# Patient Record
Sex: Female | Born: 1970 | Race: White | Hispanic: No | Marital: Single | Smoking: Never smoker
Health system: Southern US, Community
[De-identification: ages and names within clinical notes are randomized; demographics above are authoritative.]

## PROBLEM LIST (undated history)

## (undated) DIAGNOSIS — F329 Major depressive disorder, single episode, unspecified: Secondary | ICD-10-CM

## (undated) DIAGNOSIS — J45909 Unspecified asthma, uncomplicated: Secondary | ICD-10-CM

## (undated) DIAGNOSIS — T7840XA Allergy, unspecified, initial encounter: Secondary | ICD-10-CM

## (undated) DIAGNOSIS — F32A Depression, unspecified: Secondary | ICD-10-CM

## (undated) HISTORY — DX: Unspecified asthma, uncomplicated: J45.909

## (undated) HISTORY — DX: Depression, unspecified: F32.A

## (undated) HISTORY — DX: Allergy, unspecified, initial encounter: T78.40XA

## (undated) HISTORY — DX: Major depressive disorder, single episode, unspecified: F32.9

---

## 1992-10-24 HISTORY — PX: HAND SURGERY: SHX662

## 2005-06-20 ENCOUNTER — Other Ambulatory Visit: Admission: RE | Admit: 2005-06-20 | Discharge: 2005-06-20 | Payer: Self-pay | Admitting: *Deleted

## 2006-07-03 ENCOUNTER — Other Ambulatory Visit: Admission: RE | Admit: 2006-07-03 | Discharge: 2006-07-03 | Payer: Self-pay | Admitting: *Deleted

## 2007-07-16 ENCOUNTER — Other Ambulatory Visit: Admission: RE | Admit: 2007-07-16 | Discharge: 2007-07-16 | Payer: Self-pay | Admitting: *Deleted

## 2011-04-01 ENCOUNTER — Ambulatory Visit (INDEPENDENT_AMBULATORY_CARE_PROVIDER_SITE_OTHER): Payer: PRIVATE HEALTH INSURANCE

## 2011-04-01 ENCOUNTER — Inpatient Hospital Stay (INDEPENDENT_AMBULATORY_CARE_PROVIDER_SITE_OTHER)
Admission: RE | Admit: 2011-04-01 | Discharge: 2011-04-01 | Disposition: A | Discharge status: Home / Self Care | Payer: PRIVATE HEALTH INSURANCE | Source: Ambulatory Visit | Attending: Family Medicine | Admitting: Family Medicine

## 2011-04-01 DIAGNOSIS — S62639A Displaced fracture of distal phalanx of unspecified finger, initial encounter for closed fracture: Secondary | ICD-10-CM

## 2011-09-05 ENCOUNTER — Other Ambulatory Visit: Payer: Self-pay | Admitting: Family Medicine

## 2011-09-05 DIAGNOSIS — Z1231 Encounter for screening mammogram for malignant neoplasm of breast: Secondary | ICD-10-CM

## 2011-09-23 ENCOUNTER — Ambulatory Visit
Admission: RE | Admit: 2011-09-23 | Discharge: 2011-09-23 | Disposition: A | Discharge status: Home / Self Care | Payer: PRIVATE HEALTH INSURANCE | Source: Ambulatory Visit | Attending: Family Medicine | Admitting: Family Medicine

## 2011-09-23 DIAGNOSIS — Z1231 Encounter for screening mammogram for malignant neoplasm of breast: Secondary | ICD-10-CM

## 2014-10-25 ENCOUNTER — Ambulatory Visit (INDEPENDENT_AMBULATORY_CARE_PROVIDER_SITE_OTHER): Payer: Commercial Managed Care - PPO | Admitting: Family Medicine

## 2014-10-25 VITALS — BP 114/68 | HR 89 | Temp 98.1°F | Resp 18 | Ht 65.0 in | Wt 133.4 lb

## 2014-10-25 DIAGNOSIS — J029 Acute pharyngitis, unspecified: Secondary | ICD-10-CM

## 2014-10-25 DIAGNOSIS — J069 Acute upper respiratory infection, unspecified: Secondary | ICD-10-CM

## 2014-10-25 MED ORDER — AMOXICILLIN 875 MG PO TABS: 875.0000 mg | ORAL_TABLET | Freq: Two times a day (BID) | ORAL | Status: AC

## 2014-10-25 NOTE — Patient Instructions (Signed)
Drink plenty of fluids and get enough rest.  Treat head congestion with an over-the-counter antihistamine decongestant if necessary  Treat cough with Robitussin-DM or Mucinex DM or equivalent  Tylenol or ibuprofen or lozenges for discomfort.  If not improving over the next several days go ahead and take the course of amoxicillin as directed  Return if further problems arise  Pharyngitis Pharyngitis is redness, pain, and swelling (inflammation) of your pharynx.  CAUSES  Pharyngitis is usually caused by infection. Most of the time, these infections are from viruses (viral) and are part of a cold. However, sometimes pharyngitis is caused by bacteria (bacterial). Pharyngitis can also be caused by allergies. Viral pharyngitis may be spread from person to person by coughing, sneezing, and personal items or utensils (cups, forks, spoons, toothbrushes). Bacterial pharyngitis may be spread from person to person by more intimate contact, such as kissing.  SIGNS AND SYMPTOMS  Symptoms of pharyngitis include:   Sore throat.   Tiredness (fatigue).   Low-grade fever.   Headache.  Joint pain and muscle aches.  Skin rashes.  Swollen lymph nodes.  Plaque-like film on throat or tonsils (often seen with bacterial pharyngitis). DIAGNOSIS  Your health care provider will ask you questions about your illness and your symptoms. Your medical history, along with a physical exam, is often all that is needed to diagnose pharyngitis. Sometimes, a rapid strep test is done. Other lab tests may also be done, depending on the suspected cause.  TREATMENT  Viral pharyngitis will usually get better in 3-4 days without the use of medicine. Bacterial pharyngitis is treated with medicines that kill germs (antibiotics).  HOME CARE INSTRUCTIONS   Drink enough water and fluids to keep your urine clear or pale yellow.   Only take over-the-counter or prescription medicines as directed by your health care  provider:   If you are prescribed antibiotics, make sure you finish them even if you start to feel better.   Do not take aspirin.   Get lots of rest.   Gargle with 8 oz of salt water ( tsp of salt per 1 qt of water) as often as every 1-2 hours to soothe your throat.   Throat lozenges (if you are not at risk for choking) or sprays may be used to soothe your throat. SEEK MEDICAL CARE IF:   You have large, tender lumps in your neck.  You have a rash.  You cough up green, yellow-brown, or bloody spit. SEEK IMMEDIATE MEDICAL CARE IF:   Your neck becomes stiff.  You drool or are unable to swallow liquids.  You vomit or are unable to keep medicines or liquids down.  You have severe pain that does not go away with the use of recommended medicines.  You have trouble breathing (not caused by a stuffy nose). MAKE SURE YOU:   Understand these instructions.  Will watch your condition.  Will get help right away if you are not doing well or get worse. Document Released: 10/10/2005 Document Revised: 07/31/2013 Document Reviewed: 06/17/2013 [facility] Patient Information 2015 [place], [place]. This information is not intended to replace advice given to you by your health care provider. Make sure you discuss any questions you have with your health care provider.

## 2014-10-25 NOTE — Progress Notes (Signed)
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 She has been ill for about 5 days with a sore throat. She has not had any documented fevers. She doesn't feel well with it. She has had a little pain in her years. She's had some facial congestion and drainage. She does cough some. The mucus has been yellow. She does not smoke. She is not on a lot of regular medication, takes some Paxil and Singulair. She does have a regular physician at [facility]. Her nephew did have a sore throat also, she had help care for him. He had a strep test which was negative last night. Culture is pending on that.  Objective: TMs are normal. Throat not particularly erythematous. Neck was supple without significant nodes. Chest clear to auscultation. Heart regular without murmurs.  Assessment: Viral pharyngitis and upper respiratory infection  Plan: Continue to treat symptomatically. However if she is not improving over the next couple of days she is to take a course of oral amoxicillin. She is given that prescription, to destroyed if one used.

## 2015-03-26 ENCOUNTER — Other Ambulatory Visit: Payer: Self-pay | Admitting: Physician Assistant

## 2015-03-26 DIAGNOSIS — Z1231 Encounter for screening mammogram for malignant neoplasm of breast: Secondary | ICD-10-CM

## 2015-04-22 ENCOUNTER — Ambulatory Visit
Admission: RE | Admit: 2015-04-22 | Discharge: 2015-04-22 | Disposition: A | Discharge status: Home / Self Care | Payer: Commercial Managed Care - PPO | Source: Ambulatory Visit | Attending: Physician Assistant | Admitting: Physician Assistant

## 2015-04-22 DIAGNOSIS — Z1231 Encounter for screening mammogram for malignant neoplasm of breast: Secondary | ICD-10-CM

## 2015-04-23 ENCOUNTER — Other Ambulatory Visit: Payer: Self-pay | Admitting: Physician Assistant

## 2015-04-23 DIAGNOSIS — R928 Other abnormal and inconclusive findings on diagnostic imaging of breast: Secondary | ICD-10-CM

## 2015-04-30 ENCOUNTER — Ambulatory Visit
Admission: RE | Admit: 2015-04-30 | Discharge: 2015-04-30 | Disposition: A | Discharge status: Home / Self Care | Payer: Commercial Managed Care - PPO | Source: Ambulatory Visit | Attending: Physician Assistant | Admitting: Physician Assistant

## 2015-04-30 DIAGNOSIS — R928 Other abnormal and inconclusive findings on diagnostic imaging of breast: Secondary | ICD-10-CM

## 2016-05-17 ENCOUNTER — Ambulatory Visit: Payer: Commercial Managed Care - PPO

## 2016-05-17 ENCOUNTER — Other Ambulatory Visit: Payer: Self-pay | Admitting: Family Medicine

## 2016-05-17 DIAGNOSIS — Z1231 Encounter for screening mammogram for malignant neoplasm of breast: Secondary | ICD-10-CM

## 2016-05-25 ENCOUNTER — Ambulatory Visit
Admission: RE | Admit: 2016-05-25 | Discharge: 2016-05-25 | Disposition: A | Discharge status: Home / Self Care | Payer: Commercial Managed Care - PPO | Source: Ambulatory Visit | Attending: Family Medicine | Admitting: Family Medicine

## 2016-05-25 DIAGNOSIS — Z1231 Encounter for screening mammogram for malignant neoplasm of breast: Secondary | ICD-10-CM

## 2018-05-11 ENCOUNTER — Other Ambulatory Visit: Payer: Self-pay | Admitting: Family Medicine

## 2018-05-11 DIAGNOSIS — Z1231 Encounter for screening mammogram for malignant neoplasm of breast: Secondary | ICD-10-CM

## 2018-05-31 ENCOUNTER — Ambulatory Visit
Admission: RE | Admit: 2018-05-31 | Discharge: 2018-05-31 | Disposition: A | Discharge status: Home / Self Care | Payer: 59 | Source: Ambulatory Visit | Attending: Family Medicine | Admitting: Family Medicine

## 2018-05-31 DIAGNOSIS — Z1231 Encounter for screening mammogram for malignant neoplasm of breast: Secondary | ICD-10-CM

## 2018-06-01 ENCOUNTER — Other Ambulatory Visit: Payer: Self-pay | Admitting: Family Medicine

## 2018-06-01 DIAGNOSIS — R928 Other abnormal and inconclusive findings on diagnostic imaging of breast: Secondary | ICD-10-CM

## 2018-06-05 ENCOUNTER — Ambulatory Visit
Admission: RE | Admit: 2018-06-05 | Discharge: 2018-06-05 | Disposition: A | Discharge status: Home / Self Care | Payer: 59 | Source: Ambulatory Visit | Attending: Family Medicine | Admitting: Family Medicine

## 2018-06-05 DIAGNOSIS — R928 Other abnormal and inconclusive findings on diagnostic imaging of breast: Secondary | ICD-10-CM

## 2019-04-21 IMAGING — MG DIGITAL DIAGNOSTIC UNILATERAL LEFT MAMMOGRAM
6 series · 6 of 6 positions shown · non-contrast
Comparison: Previous exam(s).

CLINICAL DATA: Screening recall for left breast calcifications.

EXAM:
DIGITAL DIAGNOSTIC LEFT MAMMOGRAM WITH CAD

[L CC (1 of 2)]
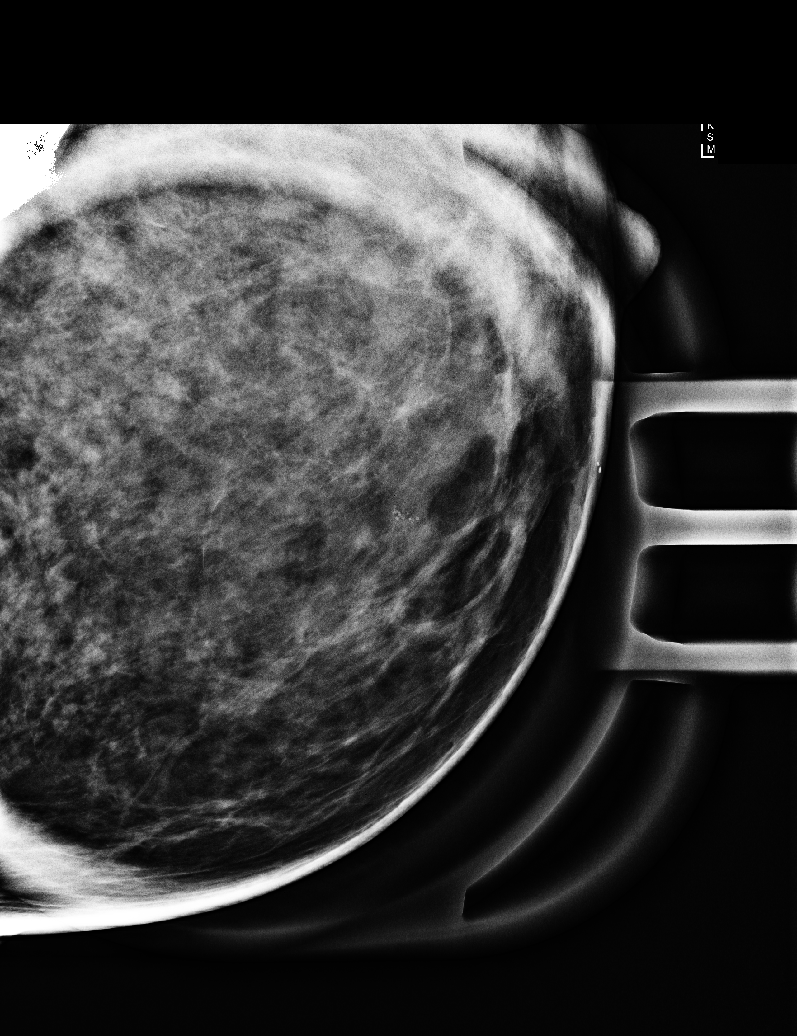

[L CC (2 of 2)]
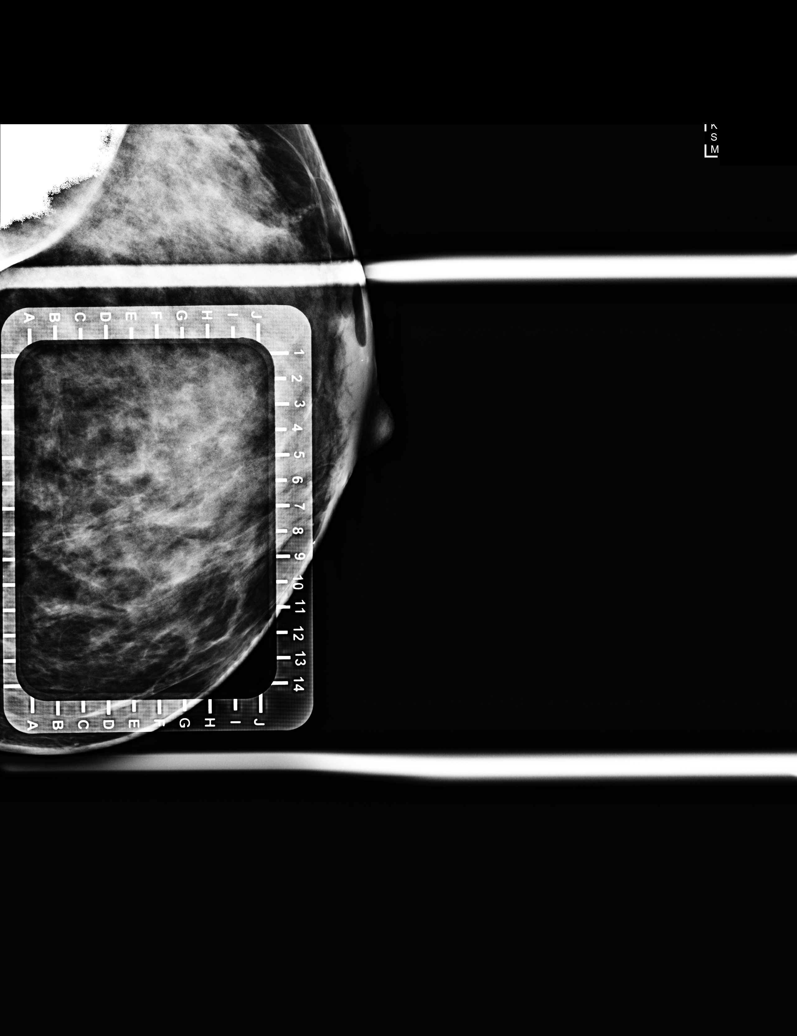

[L ML (1 of 4)]
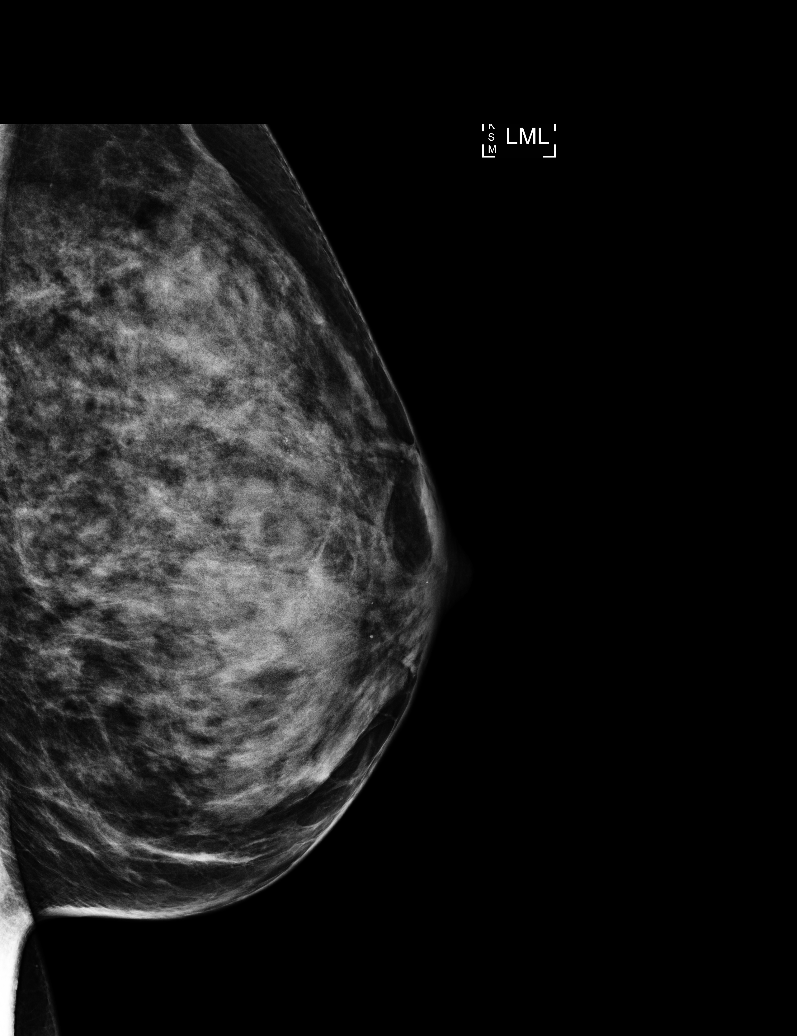

[L ML (2 of 4)]
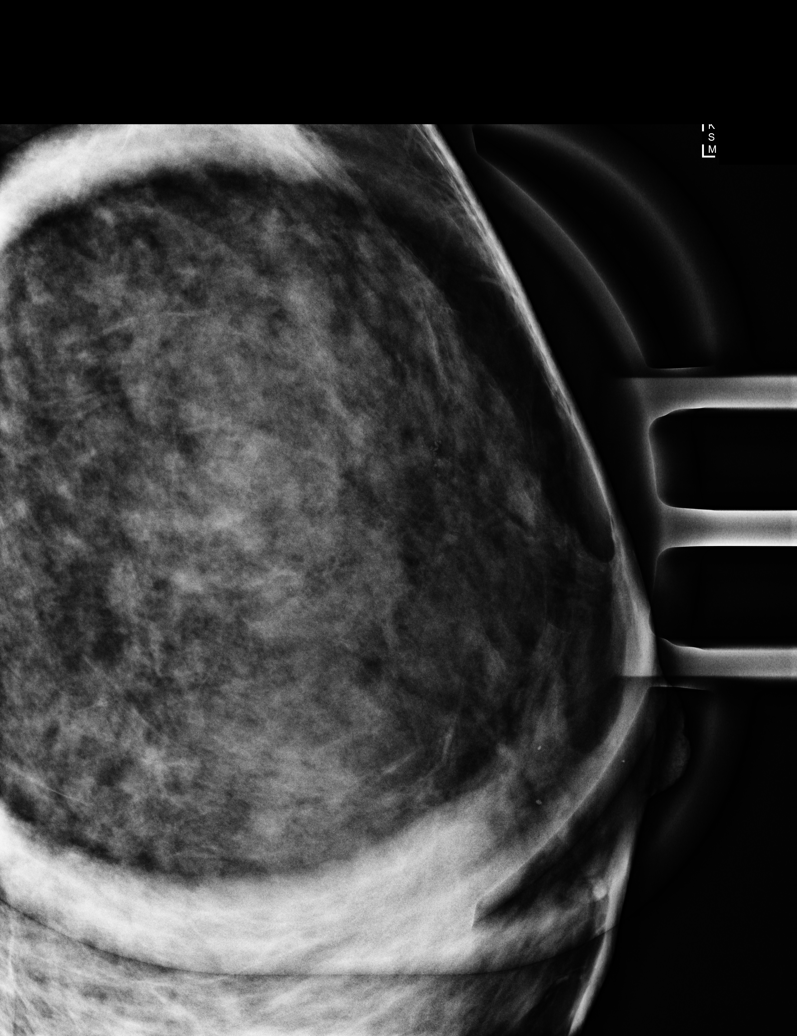

[L ML (3 of 4)]
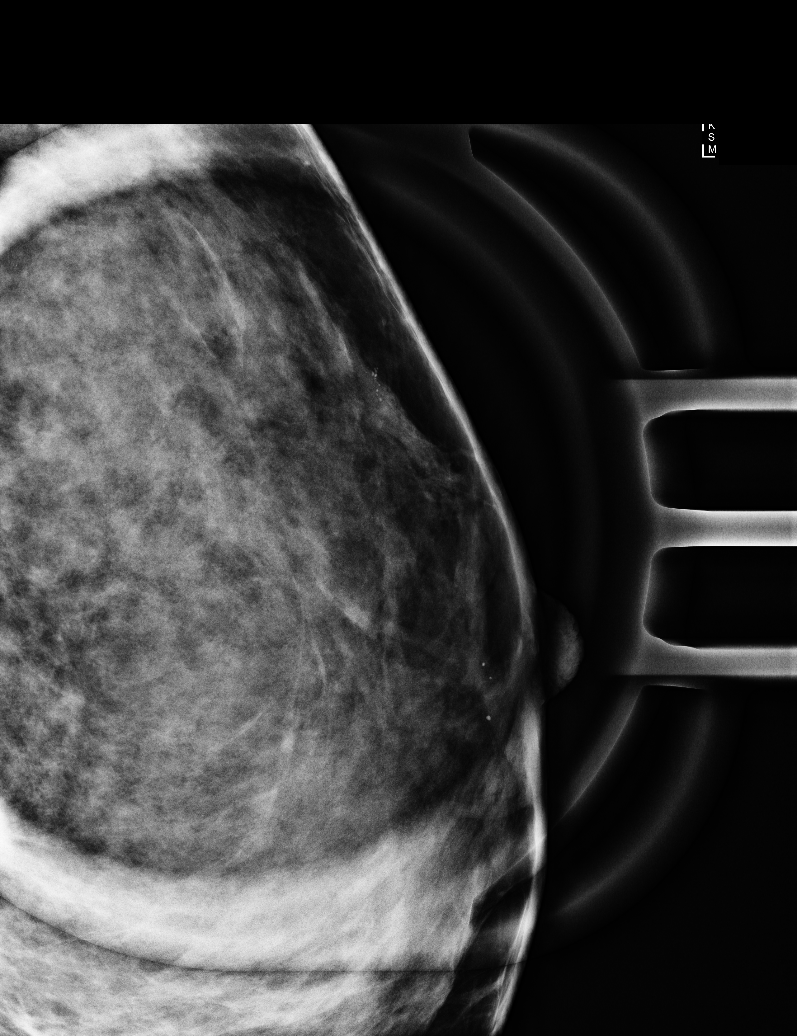

[L ML (4 of 4)]
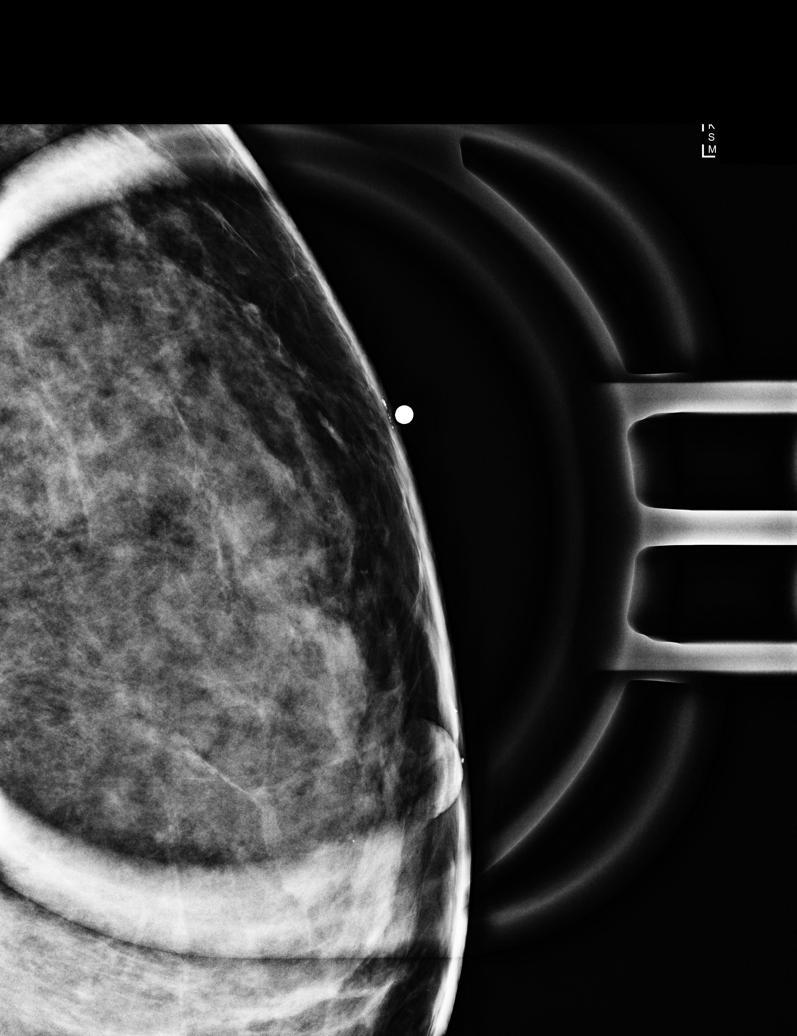

[6 of 6 positions shown; findings below may reference images not displayed]

ACR Breast Density Category d: The breast tissue is extremely dense,
which lowers the sensitivity of mammography.
FINDINGS: The small group of calcifications in the upper-outer left breast are
confirmed to be within the skin on a tangential view.

Mammographic images were processed with CAD.
IMPRESSION: The calcifications in the left breast are within the skin, and are
therefore benign.

RECOMMENDATION:
Screening mammogram in one year.(Code:[id number])

I have discussed the findings and recommendations with the patient.
Results were also provided in writing at the conclusion of the
visit. If applicable, a reminder letter will be sent to the patient
regarding the next appointment.

BI-RADS CATEGORY  2: Benign.

## 2019-07-12 ENCOUNTER — Other Ambulatory Visit: Payer: Self-pay

## 2019-07-12 DIAGNOSIS — Z20822 Contact with and (suspected) exposure to covid-19: Secondary | ICD-10-CM

## 2019-07-13 LAB — NOVEL CORONAVIRUS, NAA: SARS-CoV-2, NAA: NOT DETECTED

## 2019-07-22 ENCOUNTER — Other Ambulatory Visit: Payer: Self-pay | Admitting: *Deleted

## 2019-07-22 DIAGNOSIS — Z20822 Contact with and (suspected) exposure to covid-19: Secondary | ICD-10-CM

## 2019-07-24 LAB — SPECIMEN STATUS REPORT

## 2019-07-24 LAB — NOVEL CORONAVIRUS, NAA: SARS-CoV-2, NAA: NOT DETECTED

## 2020-12-21 ENCOUNTER — Other Ambulatory Visit: Payer: Self-pay | Admitting: Family Medicine

## 2020-12-21 DIAGNOSIS — R1013 Epigastric pain: Secondary | ICD-10-CM

## 2022-02-24 DIAGNOSIS — E7800 Pure hypercholesterolemia, unspecified: Secondary | ICD-10-CM | POA: Diagnosis not present

## 2022-02-24 DIAGNOSIS — J4520 Mild intermittent asthma, uncomplicated: Secondary | ICD-10-CM | POA: Diagnosis not present

## 2022-03-01 DIAGNOSIS — Z23 Encounter for immunization: Secondary | ICD-10-CM | POA: Diagnosis not present

## 2022-03-01 DIAGNOSIS — Z0000 Encounter for general adult medical examination without abnormal findings: Secondary | ICD-10-CM | POA: Diagnosis not present

## 2022-03-01 DIAGNOSIS — W57XXXA Bitten or stung by nonvenomous insect and other nonvenomous arthropods, initial encounter: Secondary | ICD-10-CM | POA: Diagnosis not present

## 2022-03-01 DIAGNOSIS — F325 Major depressive disorder, single episode, in full remission: Secondary | ICD-10-CM | POA: Diagnosis not present

## 2022-03-01 DIAGNOSIS — S2096XA Insect bite (nonvenomous) of unspecified parts of thorax, initial encounter: Secondary | ICD-10-CM | POA: Diagnosis not present

## 2022-03-01 DIAGNOSIS — J4520 Mild intermittent asthma, uncomplicated: Secondary | ICD-10-CM | POA: Diagnosis not present
